# Patient Record
Sex: Male | Born: 1935 | Race: White | Hispanic: No
Health system: Southern US, Community
[De-identification: ages and names within clinical notes are randomized; demographics above are authoritative.]

---

## 2001-06-29 ENCOUNTER — Encounter (HOSPITAL_BASED_OUTPATIENT_CLINIC_OR_DEPARTMENT_OTHER): Admission: RE | Admit: 2001-06-29 | Discharge: 2001-08-22 | Payer: Self-pay | Admitting: Emergency Medicine

## 2002-09-23 ENCOUNTER — Emergency Department (HOSPITAL_COMMUNITY): Admission: EM | Admit: 2002-09-23 | Discharge: 2002-09-23 | Payer: Self-pay | Admitting: Internal Medicine

## 2010-01-16 ENCOUNTER — Encounter: Admit: 2010-01-16 | Payer: Self-pay | Admitting: Family Medicine

## 2014-02-21 DIAGNOSIS — E118 Type 2 diabetes mellitus with unspecified complications: Secondary | ICD-10-CM | POA: Diagnosis not present

## 2014-02-21 DIAGNOSIS — L84 Corns and callosities: Secondary | ICD-10-CM | POA: Diagnosis not present

## 2014-02-21 DIAGNOSIS — I10 Essential (primary) hypertension: Secondary | ICD-10-CM | POA: Diagnosis not present

## 2014-03-12 DIAGNOSIS — M79609 Pain in unspecified limb: Secondary | ICD-10-CM | POA: Diagnosis not present

## 2014-03-12 DIAGNOSIS — D2372 Other benign neoplasm of skin of left lower limb, including hip: Secondary | ICD-10-CM | POA: Diagnosis not present

## 2014-03-12 DIAGNOSIS — D2371 Other benign neoplasm of skin of right lower limb, including hip: Secondary | ICD-10-CM | POA: Diagnosis not present

## 2014-05-30 DIAGNOSIS — E118 Type 2 diabetes mellitus with unspecified complications: Secondary | ICD-10-CM | POA: Diagnosis not present

## 2014-05-30 DIAGNOSIS — E119 Type 2 diabetes mellitus without complications: Secondary | ICD-10-CM | POA: Diagnosis not present

## 2014-05-30 DIAGNOSIS — I10 Essential (primary) hypertension: Secondary | ICD-10-CM | POA: Diagnosis not present

## 2014-07-30 DIAGNOSIS — I10 Essential (primary) hypertension: Secondary | ICD-10-CM | POA: Diagnosis not present

## 2014-07-30 DIAGNOSIS — E118 Type 2 diabetes mellitus with unspecified complications: Secondary | ICD-10-CM | POA: Diagnosis not present

## 2014-10-08 DIAGNOSIS — N182 Chronic kidney disease, stage 2 (mild): Secondary | ICD-10-CM | POA: Diagnosis not present

## 2014-10-08 DIAGNOSIS — Z23 Encounter for immunization: Secondary | ICD-10-CM | POA: Diagnosis not present

## 2014-10-08 DIAGNOSIS — E1122 Type 2 diabetes mellitus with diabetic chronic kidney disease: Secondary | ICD-10-CM | POA: Diagnosis not present

## 2014-10-08 DIAGNOSIS — I10 Essential (primary) hypertension: Secondary | ICD-10-CM | POA: Diagnosis not present

## 2014-10-08 DIAGNOSIS — Z0000 Encounter for general adult medical examination without abnormal findings: Secondary | ICD-10-CM | POA: Diagnosis not present

## 2015-03-18 DIAGNOSIS — E118 Type 2 diabetes mellitus with unspecified complications: Secondary | ICD-10-CM | POA: Diagnosis not present

## 2015-03-18 DIAGNOSIS — H9190 Unspecified hearing loss, unspecified ear: Secondary | ICD-10-CM | POA: Diagnosis not present

## 2015-03-18 DIAGNOSIS — Z6822 Body mass index (BMI) 22.0-22.9, adult: Secondary | ICD-10-CM | POA: Diagnosis not present

## 2015-03-18 DIAGNOSIS — I10 Essential (primary) hypertension: Secondary | ICD-10-CM | POA: Diagnosis not present

## 2015-07-16 DIAGNOSIS — E118 Type 2 diabetes mellitus with unspecified complications: Secondary | ICD-10-CM | POA: Diagnosis not present

## 2015-07-16 DIAGNOSIS — D649 Anemia, unspecified: Secondary | ICD-10-CM | POA: Diagnosis not present

## 2015-07-16 DIAGNOSIS — Z0000 Encounter for general adult medical examination without abnormal findings: Secondary | ICD-10-CM | POA: Diagnosis not present

## 2015-07-16 DIAGNOSIS — I10 Essential (primary) hypertension: Secondary | ICD-10-CM | POA: Diagnosis not present

## 2015-07-16 DIAGNOSIS — Z6821 Body mass index (BMI) 21.0-21.9, adult: Secondary | ICD-10-CM | POA: Diagnosis not present

## 2015-10-23 DIAGNOSIS — E118 Type 2 diabetes mellitus with unspecified complications: Secondary | ICD-10-CM | POA: Diagnosis not present

## 2015-10-23 DIAGNOSIS — I10 Essential (primary) hypertension: Secondary | ICD-10-CM | POA: Diagnosis not present

## 2015-12-03 DIAGNOSIS — R51 Headache: Secondary | ICD-10-CM | POA: Diagnosis not present

## 2015-12-03 DIAGNOSIS — E119 Type 2 diabetes mellitus without complications: Secondary | ICD-10-CM | POA: Diagnosis not present

## 2015-12-03 DIAGNOSIS — M26609 Unspecified temporomandibular joint disorder, unspecified side: Secondary | ICD-10-CM | POA: Diagnosis not present

## 2015-12-03 DIAGNOSIS — Z79899 Other long term (current) drug therapy: Secondary | ICD-10-CM | POA: Diagnosis not present

## 2015-12-04 ENCOUNTER — Other Ambulatory Visit: Payer: Self-pay | Admitting: Physician Assistant

## 2015-12-04 DIAGNOSIS — R51 Headache: Principal | ICD-10-CM

## 2015-12-04 DIAGNOSIS — R519 Headache, unspecified: Secondary | ICD-10-CM

## 2015-12-14 ENCOUNTER — Ambulatory Visit
Admission: RE | Admit: 2015-12-14 | Discharge: 2015-12-14 | Disposition: A | Discharge status: Home / Self Care | Payer: Medicare Other | Source: Ambulatory Visit | Attending: Physician Assistant | Admitting: Physician Assistant

## 2015-12-14 DIAGNOSIS — D329 Benign neoplasm of meninges, unspecified: Secondary | ICD-10-CM | POA: Diagnosis not present

## 2015-12-14 DIAGNOSIS — R519 Headache, unspecified: Secondary | ICD-10-CM

## 2015-12-14 DIAGNOSIS — R51 Headache: Principal | ICD-10-CM

## 2015-12-20 ENCOUNTER — Other Ambulatory Visit: Payer: Self-pay | Admitting: Internal Medicine

## 2015-12-20 DIAGNOSIS — D329 Benign neoplasm of meninges, unspecified: Secondary | ICD-10-CM

## 2016-01-27 DIAGNOSIS — E118 Type 2 diabetes mellitus with unspecified complications: Secondary | ICD-10-CM | POA: Diagnosis not present

## 2016-01-27 DIAGNOSIS — Z6821 Body mass index (BMI) 21.0-21.9, adult: Secondary | ICD-10-CM | POA: Diagnosis not present

## 2016-01-27 DIAGNOSIS — I10 Essential (primary) hypertension: Secondary | ICD-10-CM | POA: Diagnosis not present

## 2016-01-27 DIAGNOSIS — J069 Acute upper respiratory infection, unspecified: Secondary | ICD-10-CM | POA: Diagnosis not present

## 2016-04-27 DIAGNOSIS — Z6821 Body mass index (BMI) 21.0-21.9, adult: Secondary | ICD-10-CM | POA: Diagnosis not present

## 2016-04-27 DIAGNOSIS — E118 Type 2 diabetes mellitus with unspecified complications: Secondary | ICD-10-CM | POA: Diagnosis not present

## 2016-04-27 DIAGNOSIS — I10 Essential (primary) hypertension: Secondary | ICD-10-CM | POA: Diagnosis not present

## 2017-04-12 DIAGNOSIS — R143 Flatulence: Secondary | ICD-10-CM | POA: Diagnosis not present

## 2017-04-12 DIAGNOSIS — E118 Type 2 diabetes mellitus with unspecified complications: Secondary | ICD-10-CM | POA: Diagnosis not present

## 2017-04-12 DIAGNOSIS — I10 Essential (primary) hypertension: Secondary | ICD-10-CM | POA: Diagnosis not present

## 2017-07-12 DIAGNOSIS — Z0000 Encounter for general adult medical examination without abnormal findings: Secondary | ICD-10-CM | POA: Diagnosis not present

## 2017-07-12 DIAGNOSIS — E118 Type 2 diabetes mellitus with unspecified complications: Secondary | ICD-10-CM | POA: Diagnosis not present

## 2017-07-12 DIAGNOSIS — D649 Anemia, unspecified: Secondary | ICD-10-CM | POA: Diagnosis not present

## 2017-07-12 DIAGNOSIS — I10 Essential (primary) hypertension: Secondary | ICD-10-CM | POA: Diagnosis not present

## 2017-10-01 ENCOUNTER — Other Ambulatory Visit: Payer: Self-pay

## 2017-10-01 NOTE — Patient Outreach (Signed)
[facility]) Care Management  10/01/2017  Gedeon Brandow 12-Feb-1924 [id number]   Medication Adherence call to Mr. Kathreen Cornfield patient's telephone number is disconnected patient is due on Simvastatin 20 mg. Mr. Harb is showing past due under [place].   [facility] Direct Dial [phone]  Fax [phone] [email or web address]

## 2017-10-11 DIAGNOSIS — Z23 Encounter for immunization: Secondary | ICD-10-CM | POA: Diagnosis not present

## 2017-10-11 DIAGNOSIS — I10 Essential (primary) hypertension: Secondary | ICD-10-CM | POA: Diagnosis not present

## 2017-10-11 DIAGNOSIS — R51 Headache: Secondary | ICD-10-CM | POA: Diagnosis not present

## 2017-10-11 DIAGNOSIS — Z6821 Body mass index (BMI) 21.0-21.9, adult: Secondary | ICD-10-CM | POA: Diagnosis not present

## 2017-10-11 DIAGNOSIS — E118 Type 2 diabetes mellitus with unspecified complications: Secondary | ICD-10-CM | POA: Diagnosis not present

## 2017-10-13 ENCOUNTER — Other Ambulatory Visit: Payer: Self-pay | Admitting: Family Medicine

## 2017-10-13 DIAGNOSIS — R519 Headache, unspecified: Secondary | ICD-10-CM

## 2017-10-13 DIAGNOSIS — R51 Headache: Principal | ICD-10-CM

## 2017-10-18 ENCOUNTER — Inpatient Hospital Stay: Admission: RE | Admit: 2017-10-18 | Payer: Medicare Other | Source: Ambulatory Visit

## 2017-10-28 ENCOUNTER — Ambulatory Visit
Admission: RE | Admit: 2017-10-28 | Discharge: 2017-10-28 | Disposition: A | Discharge status: Home / Self Care | Payer: Medicare Other | Source: Ambulatory Visit | Attending: Family Medicine | Admitting: Family Medicine

## 2017-10-28 DIAGNOSIS — R51 Headache: Secondary | ICD-10-CM | POA: Diagnosis not present

## 2017-10-28 DIAGNOSIS — G8190 Hemiplegia, unspecified affecting unspecified side: Secondary | ICD-10-CM | POA: Diagnosis not present

## 2017-10-28 DIAGNOSIS — R519 Headache, unspecified: Secondary | ICD-10-CM

## 2017-11-05 DIAGNOSIS — Z7984 Long term (current) use of oral hypoglycemic drugs: Secondary | ICD-10-CM | POA: Diagnosis not present

## 2017-11-05 DIAGNOSIS — G8314 Monoplegia of lower limb affecting left nondominant side: Secondary | ICD-10-CM | POA: Diagnosis not present

## 2017-11-05 DIAGNOSIS — D329 Benign neoplasm of meninges, unspecified: Secondary | ICD-10-CM | POA: Diagnosis not present

## 2017-11-05 DIAGNOSIS — E119 Type 2 diabetes mellitus without complications: Secondary | ICD-10-CM | POA: Diagnosis not present

## 2017-11-05 DIAGNOSIS — H543 Unqualified visual loss, both eyes: Secondary | ICD-10-CM | POA: Diagnosis not present

## 2017-11-05 DIAGNOSIS — I6782 Cerebral ischemia: Secondary | ICD-10-CM | POA: Diagnosis not present

## 2017-11-05 DIAGNOSIS — I10 Essential (primary) hypertension: Secondary | ICD-10-CM | POA: Diagnosis not present

## 2017-11-10 DIAGNOSIS — I6782 Cerebral ischemia: Secondary | ICD-10-CM | POA: Diagnosis not present

## 2017-11-10 DIAGNOSIS — H543 Unqualified visual loss, both eyes: Secondary | ICD-10-CM | POA: Diagnosis not present

## 2017-11-10 DIAGNOSIS — Z7984 Long term (current) use of oral hypoglycemic drugs: Secondary | ICD-10-CM | POA: Diagnosis not present

## 2017-11-10 DIAGNOSIS — I10 Essential (primary) hypertension: Secondary | ICD-10-CM | POA: Diagnosis not present

## 2017-11-10 DIAGNOSIS — D329 Benign neoplasm of meninges, unspecified: Secondary | ICD-10-CM | POA: Diagnosis not present

## 2017-11-10 DIAGNOSIS — G8314 Monoplegia of lower limb affecting left nondominant side: Secondary | ICD-10-CM | POA: Diagnosis not present

## 2017-11-10 DIAGNOSIS — E119 Type 2 diabetes mellitus without complications: Secondary | ICD-10-CM | POA: Diagnosis not present

## 2017-11-12 DIAGNOSIS — G8314 Monoplegia of lower limb affecting left nondominant side: Secondary | ICD-10-CM | POA: Diagnosis not present

## 2017-11-12 DIAGNOSIS — Z7984 Long term (current) use of oral hypoglycemic drugs: Secondary | ICD-10-CM | POA: Diagnosis not present

## 2017-11-12 DIAGNOSIS — E119 Type 2 diabetes mellitus without complications: Secondary | ICD-10-CM | POA: Diagnosis not present

## 2017-11-12 DIAGNOSIS — D329 Benign neoplasm of meninges, unspecified: Secondary | ICD-10-CM | POA: Diagnosis not present

## 2017-11-12 DIAGNOSIS — H543 Unqualified visual loss, both eyes: Secondary | ICD-10-CM | POA: Diagnosis not present

## 2017-11-12 DIAGNOSIS — I6782 Cerebral ischemia: Secondary | ICD-10-CM | POA: Diagnosis not present

## 2017-11-12 DIAGNOSIS — I10 Essential (primary) hypertension: Secondary | ICD-10-CM | POA: Diagnosis not present

## 2017-11-15 DIAGNOSIS — E118 Type 2 diabetes mellitus with unspecified complications: Secondary | ICD-10-CM | POA: Diagnosis not present

## 2017-11-15 DIAGNOSIS — I10 Essential (primary) hypertension: Secondary | ICD-10-CM | POA: Diagnosis not present

## 2017-11-16 DIAGNOSIS — H543 Unqualified visual loss, both eyes: Secondary | ICD-10-CM | POA: Diagnosis not present

## 2017-11-16 DIAGNOSIS — I6782 Cerebral ischemia: Secondary | ICD-10-CM | POA: Diagnosis not present

## 2017-11-16 DIAGNOSIS — Z7984 Long term (current) use of oral hypoglycemic drugs: Secondary | ICD-10-CM | POA: Diagnosis not present

## 2017-11-16 DIAGNOSIS — I10 Essential (primary) hypertension: Secondary | ICD-10-CM | POA: Diagnosis not present

## 2017-11-16 DIAGNOSIS — G8314 Monoplegia of lower limb affecting left nondominant side: Secondary | ICD-10-CM | POA: Diagnosis not present

## 2017-11-16 DIAGNOSIS — E119 Type 2 diabetes mellitus without complications: Secondary | ICD-10-CM | POA: Diagnosis not present

## 2017-11-16 DIAGNOSIS — D329 Benign neoplasm of meninges, unspecified: Secondary | ICD-10-CM | POA: Diagnosis not present

## 2017-11-17 DIAGNOSIS — I6782 Cerebral ischemia: Secondary | ICD-10-CM | POA: Diagnosis not present

## 2017-11-17 DIAGNOSIS — G8314 Monoplegia of lower limb affecting left nondominant side: Secondary | ICD-10-CM | POA: Diagnosis not present

## 2017-11-17 DIAGNOSIS — D329 Benign neoplasm of meninges, unspecified: Secondary | ICD-10-CM | POA: Diagnosis not present

## 2017-11-17 DIAGNOSIS — Z7984 Long term (current) use of oral hypoglycemic drugs: Secondary | ICD-10-CM | POA: Diagnosis not present

## 2017-11-17 DIAGNOSIS — H543 Unqualified visual loss, both eyes: Secondary | ICD-10-CM | POA: Diagnosis not present

## 2017-11-17 DIAGNOSIS — I10 Essential (primary) hypertension: Secondary | ICD-10-CM | POA: Diagnosis not present

## 2017-11-17 DIAGNOSIS — E119 Type 2 diabetes mellitus without complications: Secondary | ICD-10-CM | POA: Diagnosis not present

## 2017-11-18 DIAGNOSIS — E119 Type 2 diabetes mellitus without complications: Secondary | ICD-10-CM | POA: Diagnosis not present

## 2017-11-18 DIAGNOSIS — G8314 Monoplegia of lower limb affecting left nondominant side: Secondary | ICD-10-CM | POA: Diagnosis not present

## 2017-11-18 DIAGNOSIS — I6782 Cerebral ischemia: Secondary | ICD-10-CM | POA: Diagnosis not present

## 2017-11-18 DIAGNOSIS — Z7984 Long term (current) use of oral hypoglycemic drugs: Secondary | ICD-10-CM | POA: Diagnosis not present

## 2017-11-18 DIAGNOSIS — I10 Essential (primary) hypertension: Secondary | ICD-10-CM | POA: Diagnosis not present

## 2017-11-18 DIAGNOSIS — D329 Benign neoplasm of meninges, unspecified: Secondary | ICD-10-CM | POA: Diagnosis not present

## 2017-11-18 DIAGNOSIS — H543 Unqualified visual loss, both eyes: Secondary | ICD-10-CM | POA: Diagnosis not present

## 2017-11-22 DIAGNOSIS — R269 Unspecified abnormalities of gait and mobility: Secondary | ICD-10-CM | POA: Diagnosis not present

## 2017-11-23 DIAGNOSIS — I6782 Cerebral ischemia: Secondary | ICD-10-CM | POA: Diagnosis not present

## 2017-11-23 DIAGNOSIS — Z7984 Long term (current) use of oral hypoglycemic drugs: Secondary | ICD-10-CM | POA: Diagnosis not present

## 2017-11-23 DIAGNOSIS — D329 Benign neoplasm of meninges, unspecified: Secondary | ICD-10-CM | POA: Diagnosis not present

## 2017-11-23 DIAGNOSIS — G8314 Monoplegia of lower limb affecting left nondominant side: Secondary | ICD-10-CM | POA: Diagnosis not present

## 2017-11-23 DIAGNOSIS — I10 Essential (primary) hypertension: Secondary | ICD-10-CM | POA: Diagnosis not present

## 2017-11-23 DIAGNOSIS — E119 Type 2 diabetes mellitus without complications: Secondary | ICD-10-CM | POA: Diagnosis not present

## 2017-11-23 DIAGNOSIS — H543 Unqualified visual loss, both eyes: Secondary | ICD-10-CM | POA: Diagnosis not present

## 2017-11-24 DIAGNOSIS — I6782 Cerebral ischemia: Secondary | ICD-10-CM | POA: Diagnosis not present

## 2017-11-24 DIAGNOSIS — E119 Type 2 diabetes mellitus without complications: Secondary | ICD-10-CM | POA: Diagnosis not present

## 2017-11-24 DIAGNOSIS — I10 Essential (primary) hypertension: Secondary | ICD-10-CM | POA: Diagnosis not present

## 2017-11-24 DIAGNOSIS — G8314 Monoplegia of lower limb affecting left nondominant side: Secondary | ICD-10-CM | POA: Diagnosis not present

## 2017-11-24 DIAGNOSIS — D329 Benign neoplasm of meninges, unspecified: Secondary | ICD-10-CM | POA: Diagnosis not present

## 2017-11-24 DIAGNOSIS — H543 Unqualified visual loss, both eyes: Secondary | ICD-10-CM | POA: Diagnosis not present

## 2017-11-24 DIAGNOSIS — Z7984 Long term (current) use of oral hypoglycemic drugs: Secondary | ICD-10-CM | POA: Diagnosis not present

## 2017-11-25 DIAGNOSIS — G8314 Monoplegia of lower limb affecting left nondominant side: Secondary | ICD-10-CM | POA: Diagnosis not present

## 2017-11-25 DIAGNOSIS — E119 Type 2 diabetes mellitus without complications: Secondary | ICD-10-CM | POA: Diagnosis not present

## 2017-11-25 DIAGNOSIS — D329 Benign neoplasm of meninges, unspecified: Secondary | ICD-10-CM | POA: Diagnosis not present

## 2017-11-25 DIAGNOSIS — I10 Essential (primary) hypertension: Secondary | ICD-10-CM | POA: Diagnosis not present

## 2017-11-25 DIAGNOSIS — I6782 Cerebral ischemia: Secondary | ICD-10-CM | POA: Diagnosis not present

## 2017-11-25 DIAGNOSIS — H543 Unqualified visual loss, both eyes: Secondary | ICD-10-CM | POA: Diagnosis not present

## 2017-11-25 DIAGNOSIS — Z7984 Long term (current) use of oral hypoglycemic drugs: Secondary | ICD-10-CM | POA: Diagnosis not present

## 2017-11-30 DIAGNOSIS — H543 Unqualified visual loss, both eyes: Secondary | ICD-10-CM | POA: Diagnosis not present

## 2017-11-30 DIAGNOSIS — I6782 Cerebral ischemia: Secondary | ICD-10-CM | POA: Diagnosis not present

## 2017-11-30 DIAGNOSIS — E119 Type 2 diabetes mellitus without complications: Secondary | ICD-10-CM | POA: Diagnosis not present

## 2017-11-30 DIAGNOSIS — Z7984 Long term (current) use of oral hypoglycemic drugs: Secondary | ICD-10-CM | POA: Diagnosis not present

## 2017-11-30 DIAGNOSIS — G8314 Monoplegia of lower limb affecting left nondominant side: Secondary | ICD-10-CM | POA: Diagnosis not present

## 2017-11-30 DIAGNOSIS — D329 Benign neoplasm of meninges, unspecified: Secondary | ICD-10-CM | POA: Diagnosis not present

## 2017-11-30 DIAGNOSIS — I10 Essential (primary) hypertension: Secondary | ICD-10-CM | POA: Diagnosis not present

## 2017-12-01 DIAGNOSIS — H543 Unqualified visual loss, both eyes: Secondary | ICD-10-CM | POA: Diagnosis not present

## 2017-12-01 DIAGNOSIS — I6782 Cerebral ischemia: Secondary | ICD-10-CM | POA: Diagnosis not present

## 2017-12-01 DIAGNOSIS — Z7984 Long term (current) use of oral hypoglycemic drugs: Secondary | ICD-10-CM | POA: Diagnosis not present

## 2017-12-01 DIAGNOSIS — G8314 Monoplegia of lower limb affecting left nondominant side: Secondary | ICD-10-CM | POA: Diagnosis not present

## 2017-12-01 DIAGNOSIS — D329 Benign neoplasm of meninges, unspecified: Secondary | ICD-10-CM | POA: Diagnosis not present

## 2017-12-01 DIAGNOSIS — I10 Essential (primary) hypertension: Secondary | ICD-10-CM | POA: Diagnosis not present

## 2017-12-01 DIAGNOSIS — E119 Type 2 diabetes mellitus without complications: Secondary | ICD-10-CM | POA: Diagnosis not present

## 2017-12-03 DIAGNOSIS — Z7984 Long term (current) use of oral hypoglycemic drugs: Secondary | ICD-10-CM | POA: Diagnosis not present

## 2017-12-03 DIAGNOSIS — H543 Unqualified visual loss, both eyes: Secondary | ICD-10-CM | POA: Diagnosis not present

## 2017-12-03 DIAGNOSIS — E119 Type 2 diabetes mellitus without complications: Secondary | ICD-10-CM | POA: Diagnosis not present

## 2017-12-03 DIAGNOSIS — D329 Benign neoplasm of meninges, unspecified: Secondary | ICD-10-CM | POA: Diagnosis not present

## 2017-12-03 DIAGNOSIS — G8314 Monoplegia of lower limb affecting left nondominant side: Secondary | ICD-10-CM | POA: Diagnosis not present

## 2017-12-03 DIAGNOSIS — I6782 Cerebral ischemia: Secondary | ICD-10-CM | POA: Diagnosis not present

## 2017-12-03 DIAGNOSIS — I10 Essential (primary) hypertension: Secondary | ICD-10-CM | POA: Diagnosis not present

## 2018-01-24 DIAGNOSIS — E118 Type 2 diabetes mellitus with unspecified complications: Secondary | ICD-10-CM | POA: Diagnosis not present

## 2018-01-24 DIAGNOSIS — I10 Essential (primary) hypertension: Secondary | ICD-10-CM | POA: Diagnosis not present

## 2018-02-05 DEATH — deceased

## 2019-12-31 IMAGING — CT CT HEAD W/O CM
1 series · 15 of 30 positions shown, 19 images · non-contrast
Comparison: MRI 12/14/2015

CLINICAL DATA: Left-sided hemiparesis episode 2-3 weeks ago lasting
2 days. Headache. Gait disturbance.

EXAM:
CT HEAD WITHOUT CONTRAST
TECHNIQUE: Contiguous axial images were obtained from the base of the skull
through the vertex without intravenous contrast.

[Series 2: head w/(date) · axial · 0.42mm/px · z∈[-140,+5]mm · 15 of 33 slices shown, 19 images]
[im 2/33  brain]
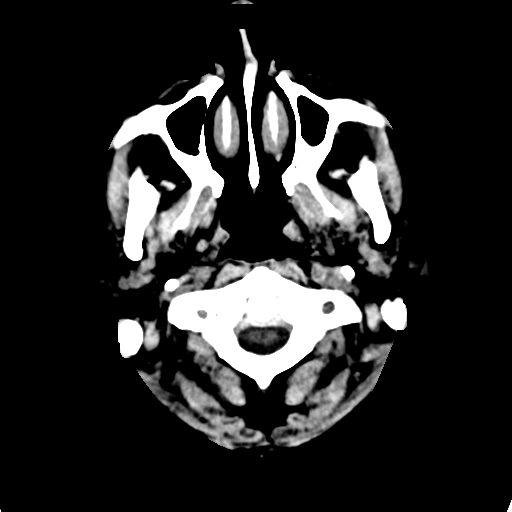
[im 2/33  bone]
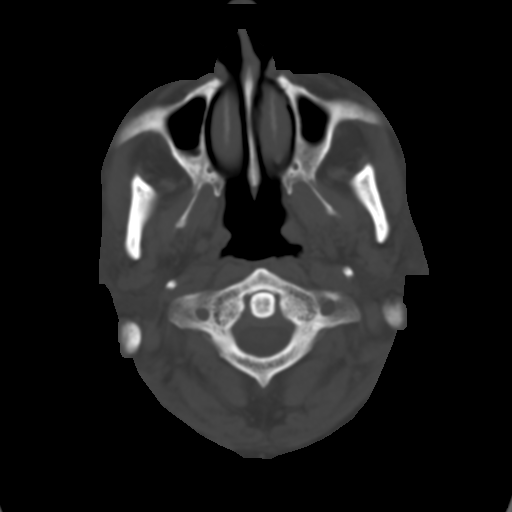
[im 4/33  brain]
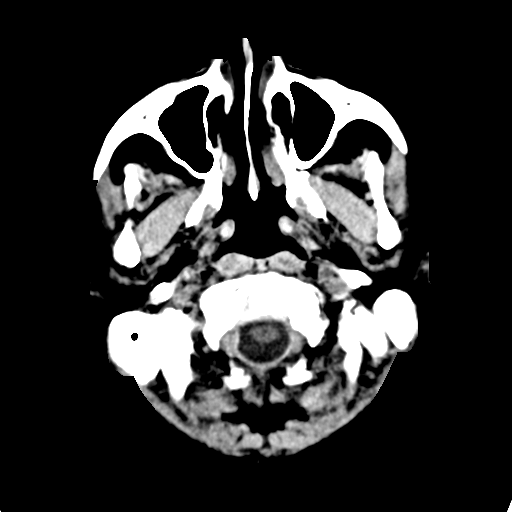
[im 6/33  brain]
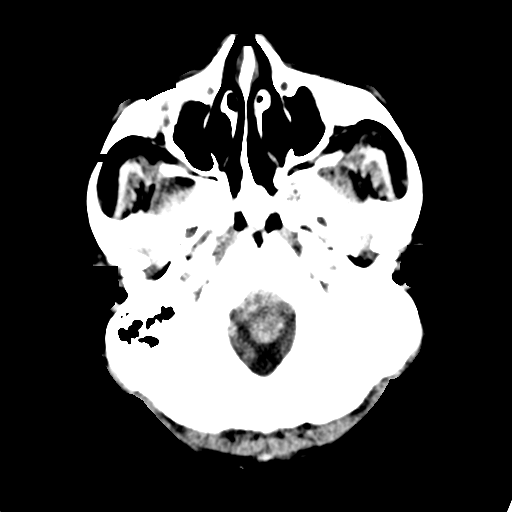
[im 8/33  brain]
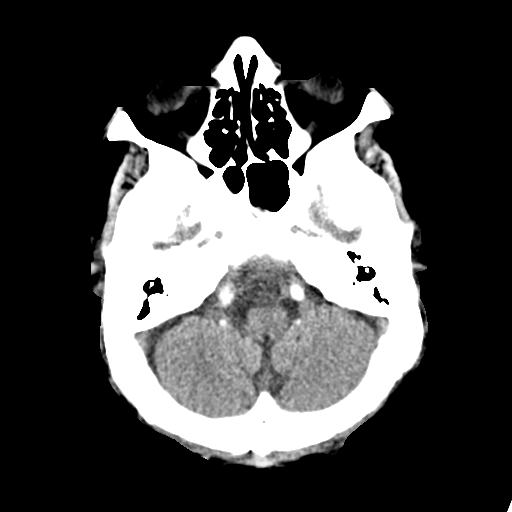
[im 10/33  brain]
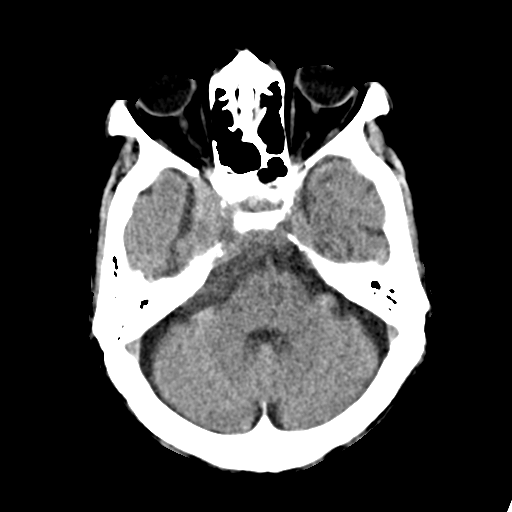
[im 10/33  bone]
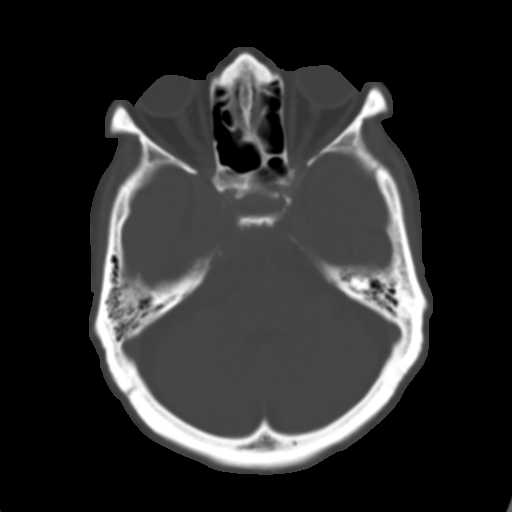
[im 13/33  brain]
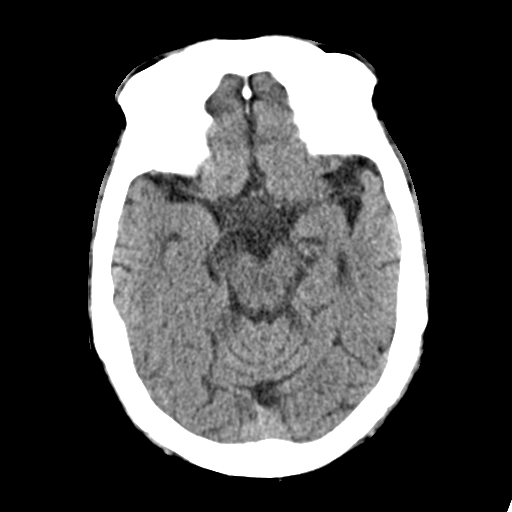
[im 15/33  brain]
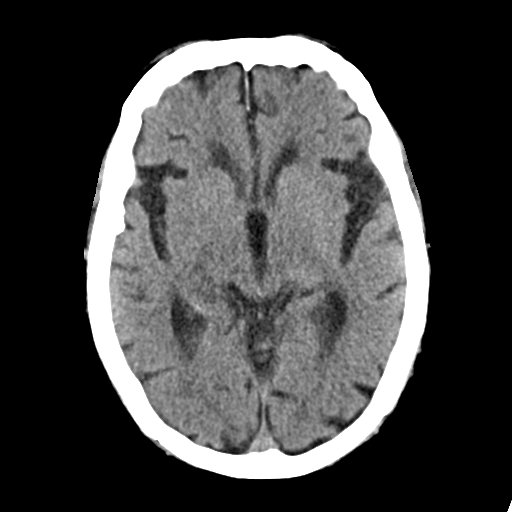
[im 17/33  brain]
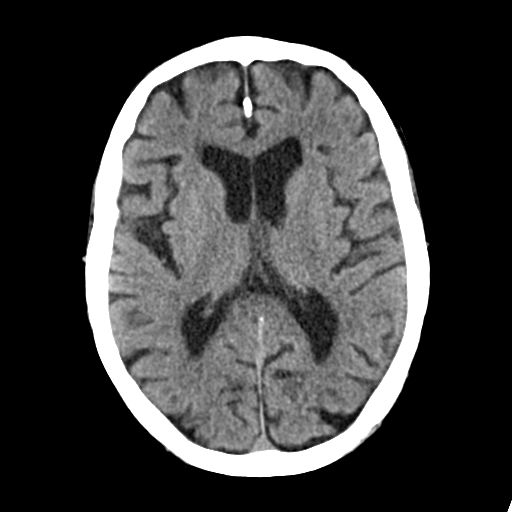
[im 18/33  brain]
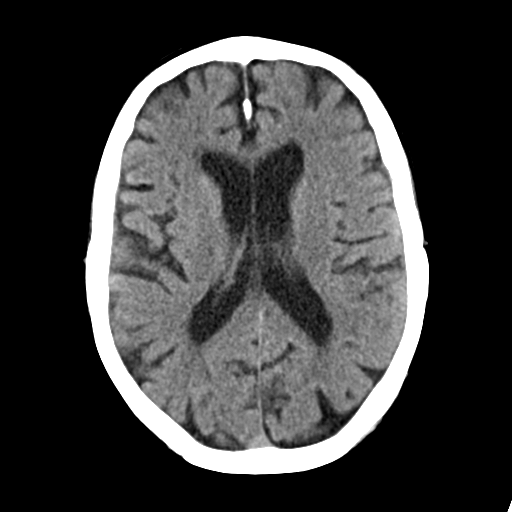
[im 18/33  bone]
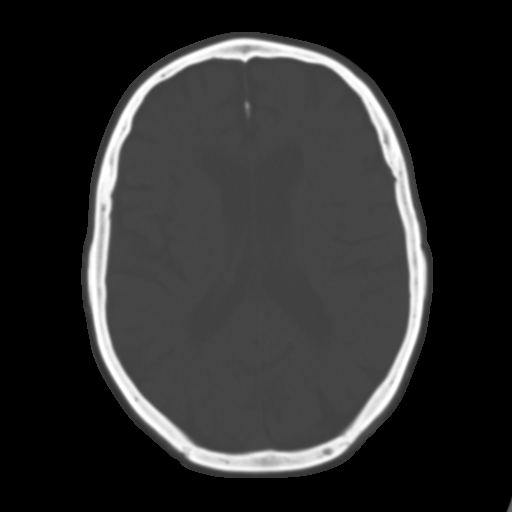
[im 20/33  brain]
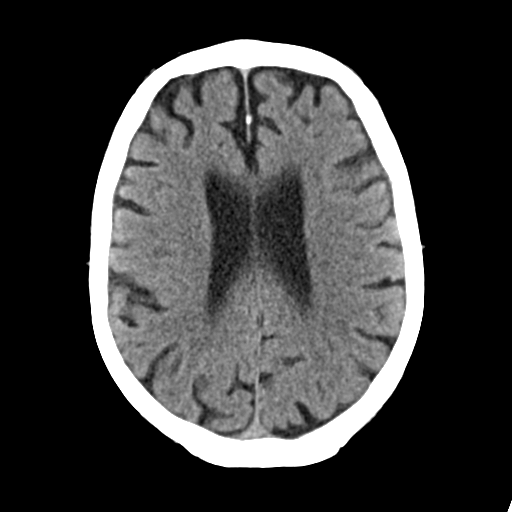
[im 23/33  brain]
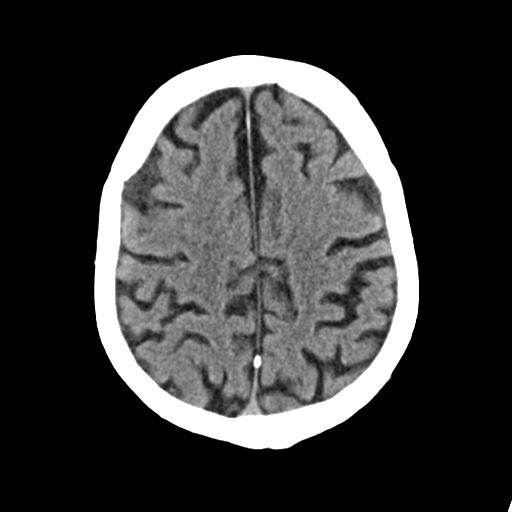
[im 25/33  brain]
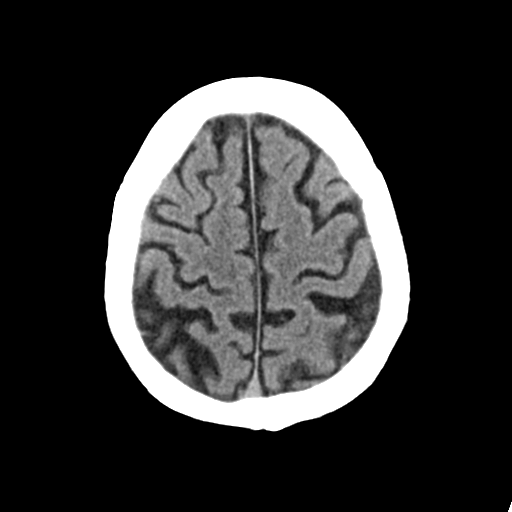
[im 27/33  brain]
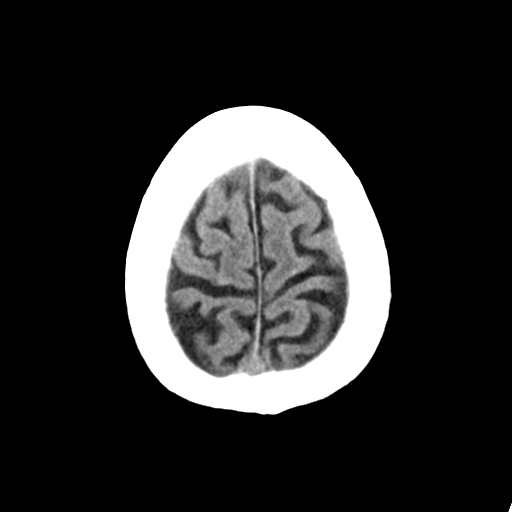
[im 27/33  bone]
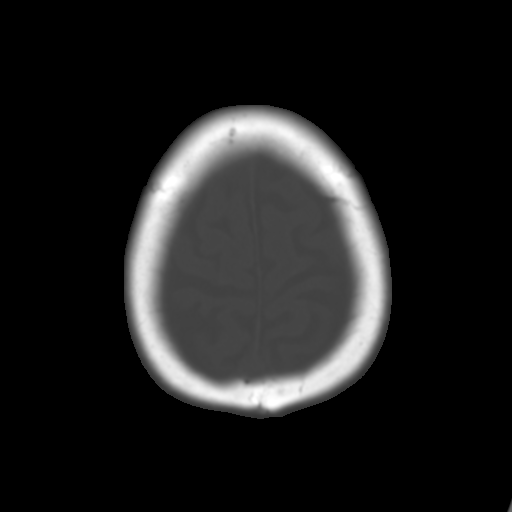
[im 29/33  brain]
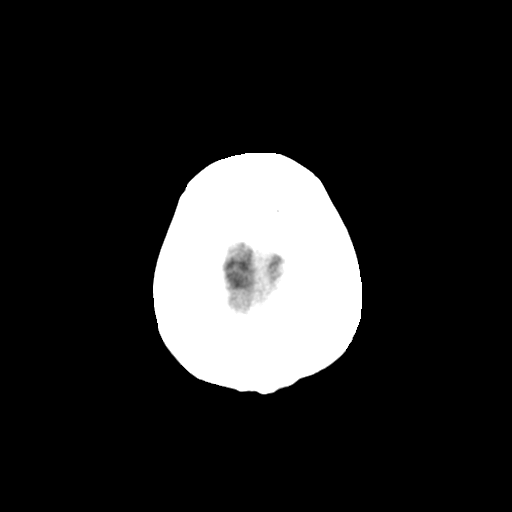
[im 31/33  brain]
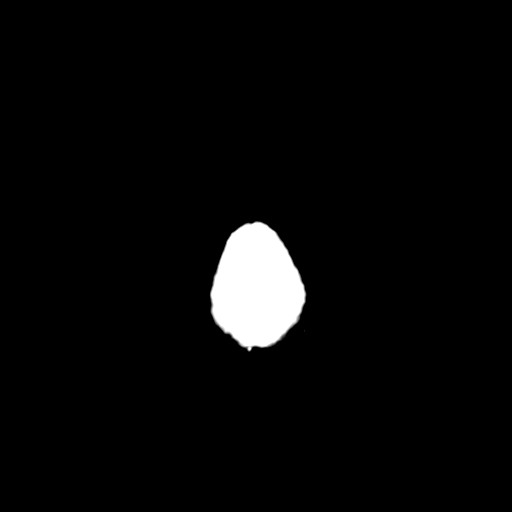

[15 of 30 positions shown; findings below may reference images not displayed]

FINDINGS: Brain: Brain itself shows generalized atrophy. There are mild
chronic small-vessel ischemic changes of the white matter. No sign
of acute infarction, intra-axial mass lesion, hemorrhage,
hydrocephalus or extra-axial collection. As seen previously, there
is a meningioma along the greater wing of the sphenoid on the right
with involvement of the cavernous sinus on the right and some
extension along the clivus. No evidence of significant mass-effect
upon the brain. This could cause cranial nerve symptoms.

Vascular: There is atherosclerotic calcification of the major
vessels at the base of the brain.

Skull: Negative

Sinuses/Orbits: Mild inflammatory changes of the sphenoid sinus.
Orbits negative.

Other: None
IMPRESSION: No acute brain parenchymal finding. Age related atrophy. Mild
chronic small-vessel ischemic change.

Chronically known meningioma along the greater wing of the sphenoid
on the right including involvement of the right cavernous sinus and
along the surface of the clivus. This would be better evaluated with
MRI but appears roughly similar to the study December 2015.
